# Patient Record
Sex: Male | Born: 1975 | Race: Black or African American | Hispanic: No | Marital: Single | Smoking: Never smoker
Health system: Southern US, Community
[De-identification: ages and names within clinical notes are randomized; demographics above are authoritative.]

## PROBLEM LIST (undated history)

## (undated) HISTORY — PX: APPENDECTOMY: SHX54

---

## 1998-06-11 ENCOUNTER — Emergency Department (HOSPITAL_COMMUNITY): Admission: EM | Admit: 1998-06-11 | Discharge: 1998-06-11 | Payer: Self-pay | Admitting: Emergency Medicine

## 1998-07-19 ENCOUNTER — Emergency Department (HOSPITAL_COMMUNITY): Admission: EM | Admit: 1998-07-19 | Discharge: 1998-07-19 | Payer: Self-pay | Admitting: Emergency Medicine

## 1998-07-19 ENCOUNTER — Encounter: Payer: Self-pay | Admitting: Emergency Medicine

## 1998-08-02 ENCOUNTER — Ambulatory Visit (HOSPITAL_BASED_OUTPATIENT_CLINIC_OR_DEPARTMENT_OTHER): Admission: RE | Admit: 1998-08-02 | Discharge: 1998-08-02 | Payer: Self-pay | Admitting: Orthopedic Surgery

## 2000-10-31 ENCOUNTER — Emergency Department (HOSPITAL_COMMUNITY): Admission: EM | Admit: 2000-10-31 | Discharge: 2000-10-31 | Payer: Self-pay | Admitting: Emergency Medicine

## 2000-10-31 ENCOUNTER — Encounter: Payer: Self-pay | Admitting: Emergency Medicine

## 2000-11-06 ENCOUNTER — Emergency Department (HOSPITAL_COMMUNITY): Admission: EM | Admit: 2000-11-06 | Discharge: 2000-11-06 | Payer: Self-pay | Admitting: Emergency Medicine

## 2001-04-15 ENCOUNTER — Encounter: Payer: Self-pay | Admitting: Emergency Medicine

## 2001-04-15 ENCOUNTER — Emergency Department (HOSPITAL_COMMUNITY): Admission: EM | Admit: 2001-04-15 | Discharge: 2001-04-16 | Payer: Self-pay | Admitting: Emergency Medicine

## 2005-07-30 ENCOUNTER — Emergency Department (HOSPITAL_COMMUNITY): Admission: EM | Admit: 2005-07-30 | Discharge: 2005-07-30 | Payer: Self-pay | Admitting: Emergency Medicine

## 2007-06-30 ENCOUNTER — Emergency Department (HOSPITAL_COMMUNITY): Admission: EM | Admit: 2007-06-30 | Discharge: 2007-06-30 | Payer: Self-pay | Admitting: Emergency Medicine

## 2007-10-27 ENCOUNTER — Emergency Department (HOSPITAL_COMMUNITY): Admission: EM | Admit: 2007-10-27 | Discharge: 2007-10-28 | Payer: Self-pay | Admitting: Emergency Medicine

## 2008-05-02 ENCOUNTER — Emergency Department (HOSPITAL_COMMUNITY): Admission: EM | Admit: 2008-05-02 | Discharge: 2008-05-02 | Payer: Self-pay | Admitting: Emergency Medicine

## 2010-10-18 LAB — POCT I-STAT, CHEM 8
BUN: 9
Calcium, Ion: 1.17
Chloride: 107
Creatinine, Ser: 1.3
Glucose, Bld: 109 — ABNORMAL HIGH
HCT: 49
Hemoglobin: 16.7
Potassium: 3.6
Sodium: 143
TCO2: 27

## 2010-11-06 IMAGING — CR DG LUMBAR SPINE COMPLETE 4+V
5 series · 5 of 5 positions shown · non-contrast
Comparison: None available.

CLINICAL DATA: Low back pain.

LUMBAR SPINE - COMPLETE 4+ VIEW

[t l-spine a.p.]
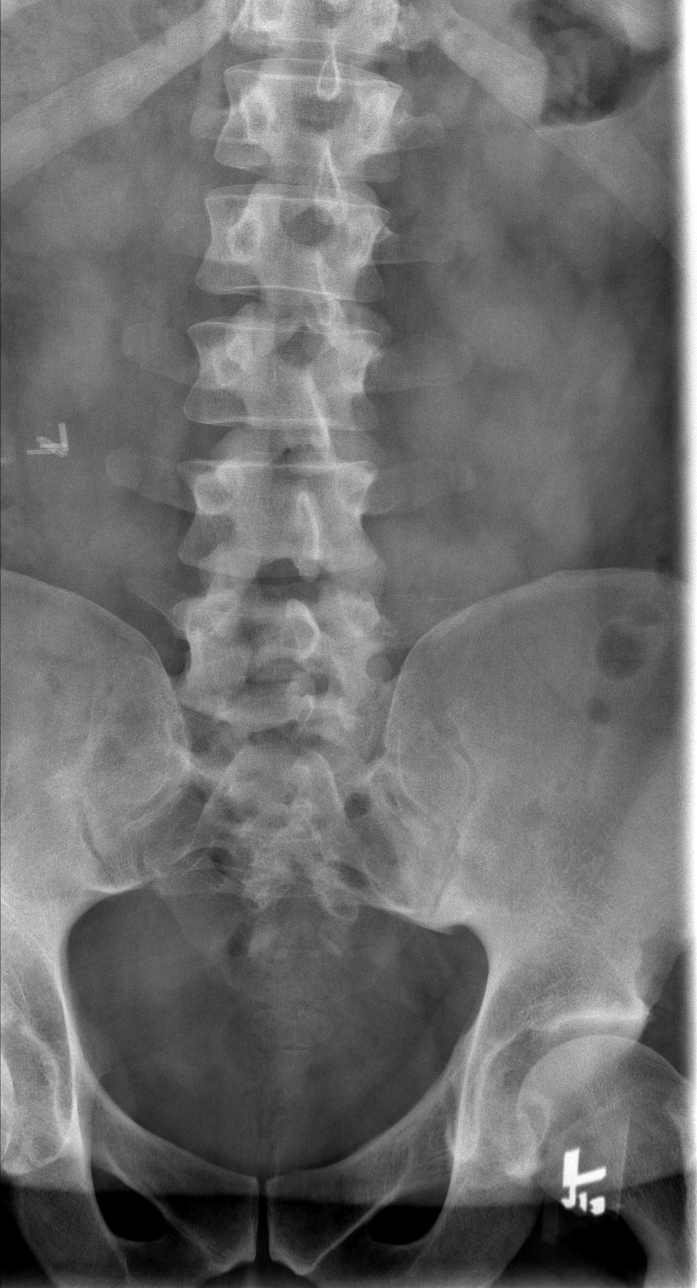

[t l-spine oblique exposure (1 of 2)]
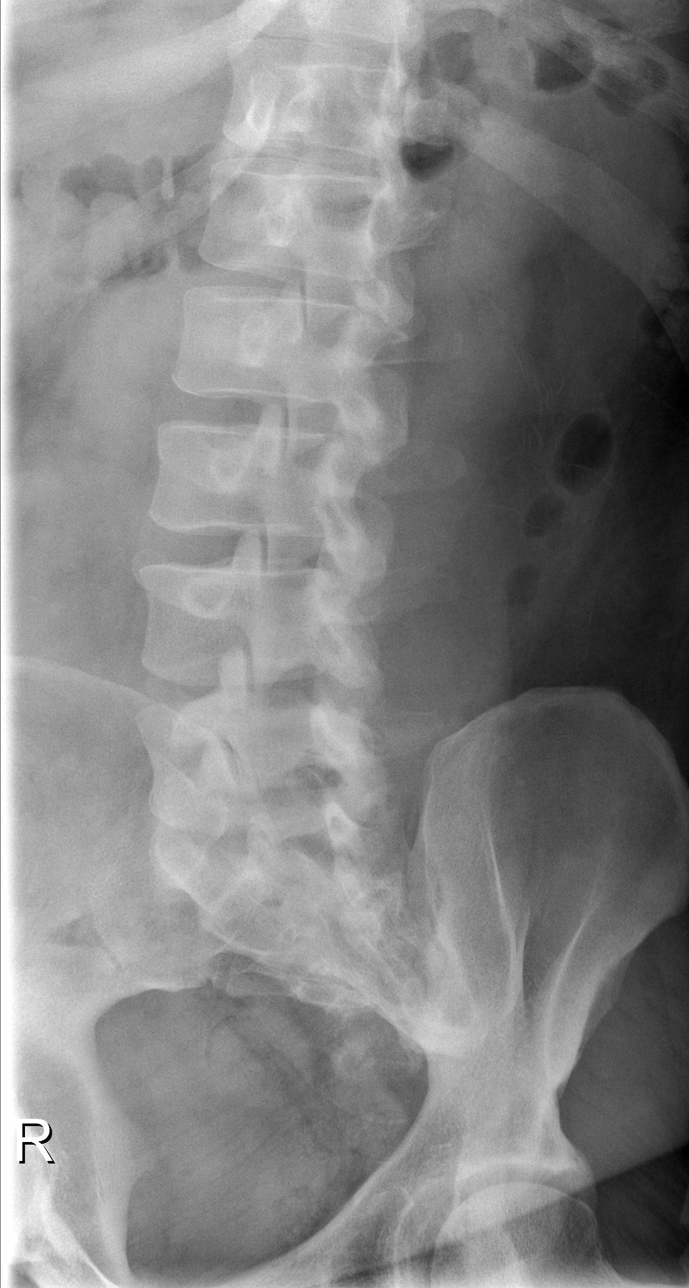

[t l-spine oblique exposure (2 of 2)]
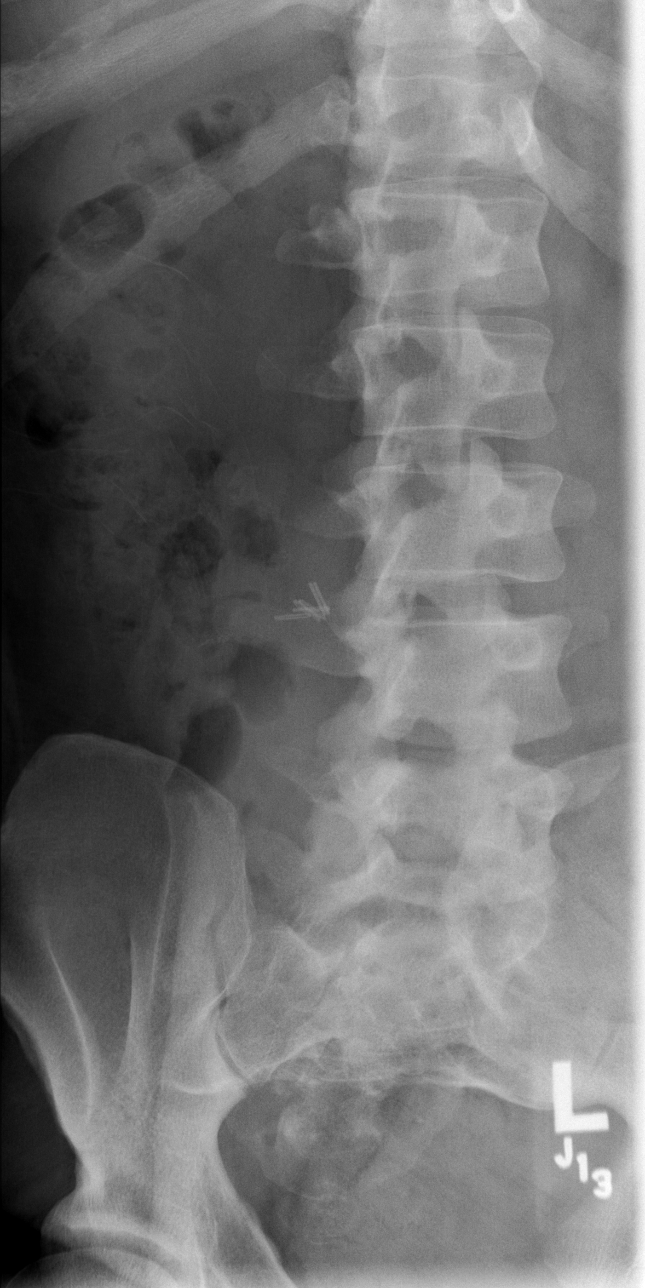

[t l-spine lat]
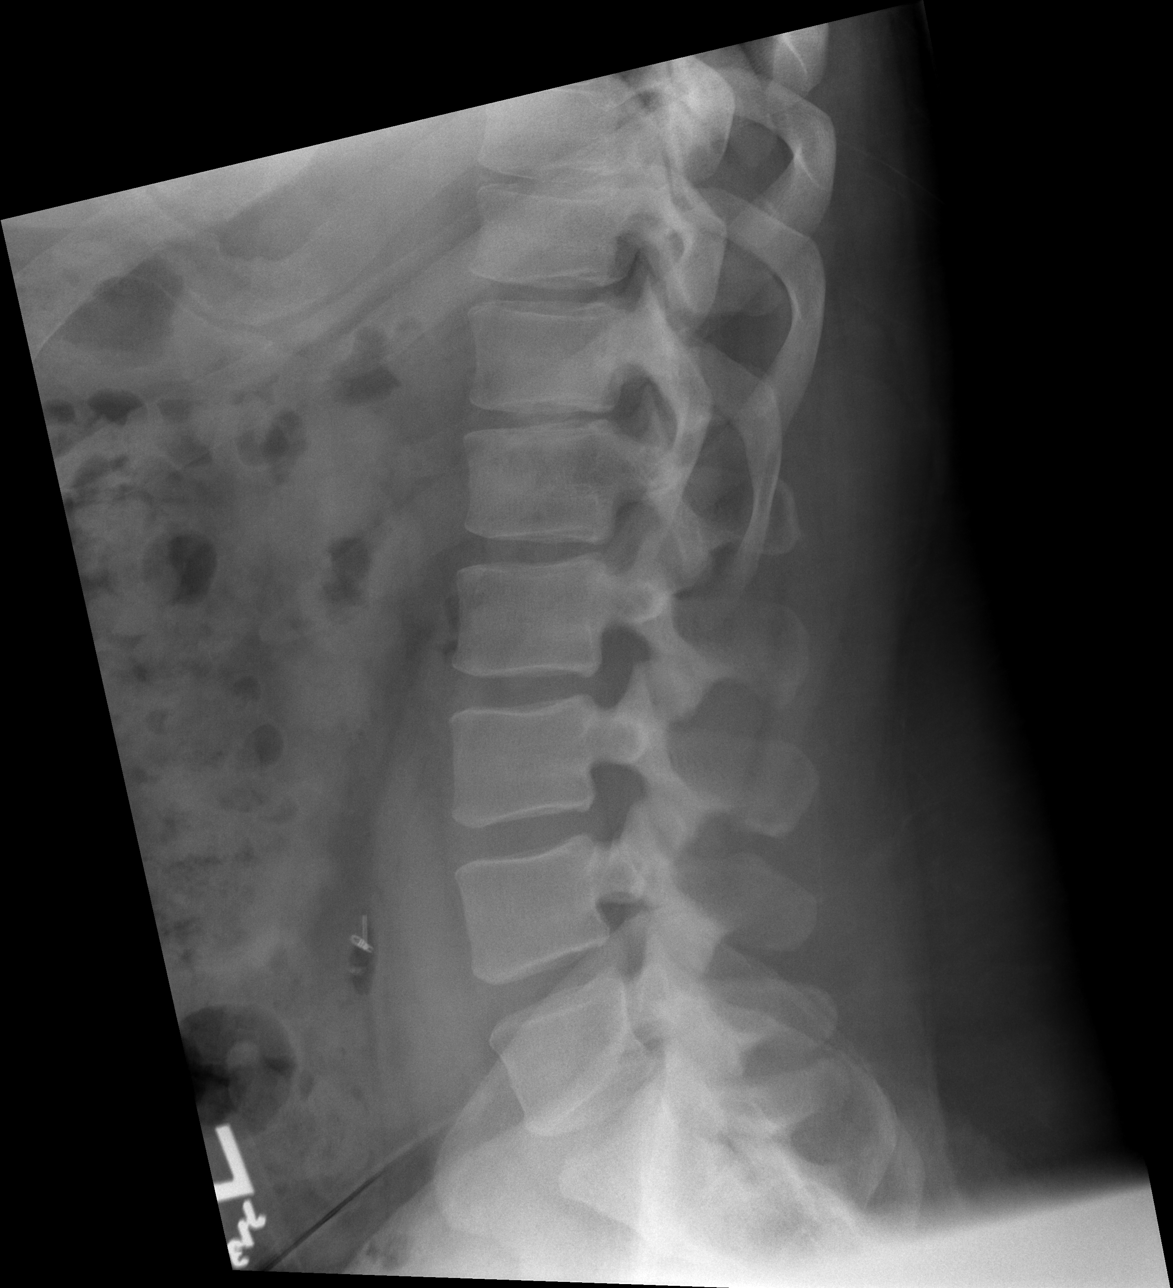

[t l-spine l5-s1 spot]
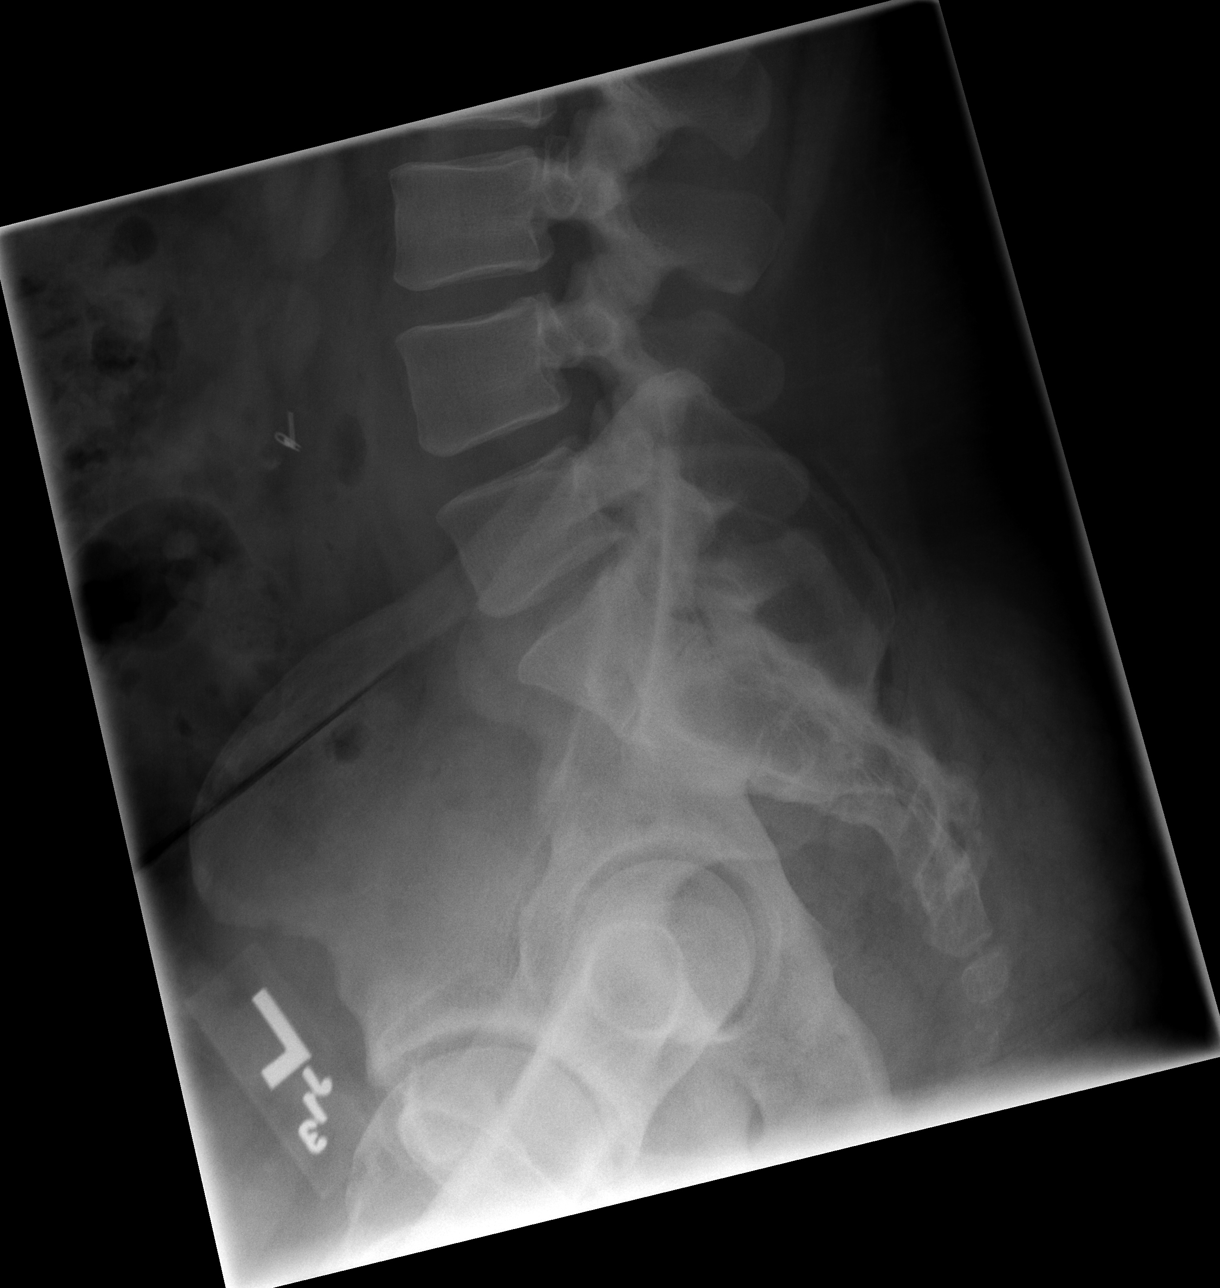

[5 of 5 positions shown; findings below may reference images not displayed]

FINDINGS: Five non-rib bearing lumbar type vertebral bodies are
present.  The vertebral body heights and alignment are maintained.
Incidental note is made of a partially lumbarized S1 segment.
There is no evidence for acute fracture or traumatic subluxation.
Surgical clips are noted in the right lower quadrant.
IMPRESSION: No acute abnormality.

## 2014-09-08 ENCOUNTER — Emergency Department (HOSPITAL_COMMUNITY)
Admission: EM | Admit: 2014-09-08 | Discharge: 2014-09-08 | Disposition: A | Discharge status: Home / Self Care | Payer: Self-pay | Attending: Emergency Medicine | Admitting: Emergency Medicine

## 2014-09-08 ENCOUNTER — Encounter (HOSPITAL_COMMUNITY): Payer: Self-pay

## 2014-09-08 DIAGNOSIS — Z202 Contact with and (suspected) exposure to infections with a predominantly sexual mode of transmission: Secondary | ICD-10-CM | POA: Insufficient documentation

## 2014-09-08 LAB — SYPHILIS: RPR W/REFLEX TO RPR TITER AND TREPONEMAL ANTIBODIES, TRADITIONAL SCREENING AND DIAGNOSIS ALGORITHM: RPR Ser Ql: NONREACTIVE

## 2014-09-08 LAB — HIV ANTIBODY (ROUTINE TESTING W REFLEX): HIV Screen 4th Generation wRfx: NONREACTIVE

## 2014-09-08 MED ORDER — CEFTRIAXONE SODIUM 250 MG IJ SOLR
250.0000 mg | Freq: Once | INTRAMUSCULAR | Status: AC
  Administered 2014-09-08: 250 mg via INTRAMUSCULAR
  Filled 2014-09-08: qty 250

## 2014-09-08 MED ORDER — METRONIDAZOLE 500 MG PO TABS: 500.0000 mg | ORAL_TABLET | Freq: Two times a day (BID) | ORAL | Status: AC

## 2014-09-08 MED ORDER — LIDOCAINE HCL (PF) 1 % IJ SOLN
INTRAMUSCULAR | Status: AC
  Administered 2014-09-08: 09:00:00
  Filled 2014-09-08: qty 5

## 2014-09-08 MED ORDER — AZITHROMYCIN 1 G PO PACK
1.0000 g | PACK | Freq: Once | ORAL | Status: AC
  Administered 2014-09-08: 1 g via ORAL
  Filled 2014-09-08: qty 1

## 2014-09-08 NOTE — ED Provider Notes (Signed)
CSN: [id number]     Arrival date & time 09/08/14  0750 History   First MD Initiated Contact with Patient 09/08/14 0759     Chief Complaint  Patient presents with  . Exposure to STD     (Consider location/radiation/quality/duration/timing/severity/associated sxs/prior Treatment) HPI Comments: Patient here complaining of exposure to Trichomonas by his partner. Denies any penile drainage or discharge. Denies any testicular pain and swelling. No groin pain or swelling either. Denies any urinary symptoms. No fever or chills. History of Chlamydia in the past. No treatment used prior to arrival  Patient is a 39 y.o. male presenting with STD exposure. The history is provided by the patient.  Exposure to STD    History reviewed. No pertinent past medical history. Past Surgical History  Procedure Laterality Date  . Appendectomy     History reviewed. No pertinent family history. Social History  Substance Use Topics  . Smoking status: Never Smoker   . Smokeless tobacco: None  . Alcohol Use: Yes    Review of Systems  All other systems reviewed and are negative.     Allergies  Review of patient's allergies indicates not on file.  Home Medications   Prior to Admission medications   Not on File   BP 150/96 mmHg  Pulse 86  Temp(Src) 98.1 F (36.7 C) (Oral)  Resp 18  SpO2 100% Physical Exam  Constitutional: He is oriented to person, place, and time. He appears well-developed and well-nourished.  Non-toxic appearance. No distress.  HENT:  Head: Normocephalic and atraumatic.  Eyes: Conjunctivae, EOM and lids are normal. Pupils are equal, round, and reactive to light.  Neck: Normal range of motion. Neck supple. No tracheal deviation present. No thyroid mass present.  Cardiovascular: Normal rate, regular rhythm and normal heart sounds.  Exam reveals no gallop.   No murmur heard. Pulmonary/Chest: Effort normal and breath sounds normal. No stridor. No respiratory distress. He has no  decreased breath sounds. He has no wheezes. He has no rhonchi. He has no rales.  Abdominal: Soft. Normal appearance and bowel sounds are normal. He exhibits no distension. There is no tenderness. There is no rebound and no CVA tenderness.  Musculoskeletal: Normal range of motion. He exhibits no edema or tenderness.  Neurological: He is alert and oriented to person, place, and time. He has normal strength. No cranial nerve deficit or sensory deficit. GCS eye subscore is 4. GCS verbal subscore is 5. GCS motor subscore is 6.  Skin: Skin is warm and dry. No abrasion and no rash noted.  Psychiatric: He has a normal mood and affect. His speech is normal and behavior is normal.  Nursing note and vitals reviewed.   ED Course  Procedures (including critical care time) Labs Review Labs Reviewed  RPR  HIV ANTIBODY (ROUTINE TESTING)  GC/CHLAMYDIA PROBE AMP ([place]) NOT AT [facility]    Imaging Review No results found. I have personally reviewed and evaluated these images and lab results as part of my medical decision-making.   EKG Interpretation None      MDM   Final diagnoses:  None    Patient treated for STD and given referral to health department  Lorre Nick, MD 09/08/14 [phone]

## 2014-09-08 NOTE — ED Notes (Signed)
Pt presents for a STD check.  Sts "a friend of mine said they were diagnosed w/ Trich."  Denies discharge, odor, or dysuria.

## 2014-09-08 NOTE — Discharge Instructions (Signed)
Safe Sex Safe sex is about reducing the risk of giving or getting a sexually transmitted disease (STD). STDs are spread through sexual contact involving the genitals, mouth, or rectum. Some STDs can be cured and others cannot. Safe sex can also prevent unintended pregnancies.  WHAT ARE SOME SAFE SEX PRACTICES?  Limit your sexual activity to only one partner who is having sex with only you.  Talk to your partner about his or her past partners, past STDs, and drug use.  Use a condom every time you have sexual intercourse. This includes vaginal, oral, and anal sexual activity. Both females and males should wear condoms during oral sex. Only use latex or polyurethane condoms and water-based lubricants. Using petroleum-based lubricants or oils to lubricate a condom will weaken the condom and increase the chance that it will break. The condom should be in place from the beginning to the end of sexual activity. Wearing a condom reduces, but does not completely eliminate, your risk of getting or giving an STD. STDs can be spread by contact with infected body fluids and skin.  Get vaccinated for hepatitis B and HPV.  Avoid alcohol and recreational drugs, which can affect your judgment. You may forget to use a condom or participate in high-risk sex.  For females, avoid douching after sexual intercourse. Douching can spread an infection farther into the reproductive tract.  Check your body for signs of sores, blisters, rashes, or unusual discharge. See your health care provider if you notice any of these signs.  Avoid sexual contact if you have symptoms of an infection or are being treated for an STD. If you or your partner has herpes, avoid sexual contact when blisters are present. Use condoms at all other times.  If you are at risk of being infected with HIV, it is recommended that you take a prescription medicine daily to prevent HIV infection. This is called pre-exposure prophylaxis (PrEP). You are  considered at risk if:  You are a man who has sex with other men (MSM).  You are a heterosexual man or woman who is sexually active with more than one partner.  You take drugs by injection.  You are sexually active with a partner who has HIV.  Talk with your health care provider about whether you are at high risk of being infected with HIV. If you choose to begin PrEP, you should first be tested for HIV. You should then be tested every 3 months for as long as you are taking PrEP.  See your health care provider for regular screenings, exams, and tests for other STDs. Before having sex with a new partner, each of you should be screened for STDs and should talk about the results with each other. WHAT ARE THE BENEFITS OF SAFE SEX?   There is less chance of getting or giving an STD.  You can prevent unwanted or unintended pregnancies.  By discussing safe sex concerns with your partner, you may increase feelings of intimacy, comfort, trust, and honesty between the two of you. Document Released: 02/15/2004 Document Revised: 05/24/2013 Document Reviewed: 07/01/2011 [facility] Patient Information 2015 [place], [place]. This information is not intended to replace advice given to you by your health care provider. Make sure you discuss any questions you have with your health care provider. Trichomoniasis Trichomoniasis is an infection caused by an organism called Trichomonas. The infection can affect both women and men. In women, the outer male genitalia and the vagina are affected. In men, the penis is mainly  mainly affected, but the prostate and other reproductive organs can also be involved. Trichomoniasis is a sexually transmitted infection (STI) and is most often passed to another person through sexual contact.  °RISK FACTORS °· Having unprotected sexual intercourse. °· Having sexual intercourse with an infected partner. °SIGNS AND SYMPTOMS  °Symptoms of trichomoniasis in women include: °· Abnormal  gray-green frothy vaginal discharge. °· Itching and irritation of the vagina. °· Itching and irritation of the area outside the vagina. °Symptoms of trichomoniasis in men include:  °· Penile discharge with or without pain. °· Pain during urination. This results from inflammation of the urethra. °DIAGNOSIS  °Trichomoniasis may be found during a Pap test or physical exam. Your health care provider may use one of the following methods to help diagnose this infection: °· Examining vaginal discharge under a microscope. For men, urethral discharge would be examined. °· Testing the pH of the vagina with a test tape. °· Using a vaginal swab test that checks for the Trichomonas organism. A test is available that provides results within a few minutes. °· Doing a culture test for the organism. This is not usually needed. °TREATMENT  °· You may be given medicine to fight the infection. Women should inform their health care provider if they could be or are pregnant. Some medicines used to treat the infection should not be taken during pregnancy. °· Your health care provider may recommend over-the-counter medicines or creams to decrease itching or irritation. °· Your sexual partner will need to be treated if infected. °HOME CARE INSTRUCTIONS  °· Take medicines only as directed by your health care provider. °· Take over-the-counter medicine for itching or irritation as directed by your health care provider. °· Do not have sexual intercourse while you have the infection. °· Women should not douche or wear tampons while they have the infection. °· Discuss your infection with your partner. Your partner may have gotten the infection from you, or you may have gotten it from your partner. °· Have your sex partner get examined and treated if necessary. °· Practice safe, informed, and protected sex. °· See your health care provider for other STI testing. °SEEK MEDICAL CARE IF:  °· You still have symptoms after you finish your  medicine. °· You develop abdominal pain. °· You have pain when you urinate. °· You have bleeding after sexual intercourse. °· You develop a rash. °· Your medicine makes you sick or makes you throw up (vomit). °MAKE SURE YOU: °· Understand these instructions. °· Will watch your condition. °· Will get help right away if you are not doing well or get worse. °Document Released: 07/03/2000 Document Revised: 05/24/2013 Document Reviewed: 10/19/2012 °[employer]® Patient Information ©2015 [employer]. This information is not intended to replace advice given to you by your health care provider. Make sure you discuss any questions you have with your health care provider. ° °

## 2014-09-09 LAB — GC/CHLAMYDIA PROBE AMP (~~LOC~~) NOT AT ARMC
CHLAMYDIA, DNA PROBE: NEGATIVE
NEISSERIA GONORRHEA: NEGATIVE
